# Patient Record
Sex: Female | Born: 2000 | Race: White | Hispanic: Yes | Marital: Single | Smoking: Never smoker
Health system: Southern US, Community
[De-identification: ages and names within clinical notes are randomized; demographics above are authoritative.]

## PROBLEM LIST (undated history)

## (undated) DIAGNOSIS — Z789 Other specified health status: Secondary | ICD-10-CM

## (undated) HISTORY — PX: NO PAST SURGERIES: SHX2092

---
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
## 2022-03-24 NOTE — L&D Delivery Note (Signed)
Operative Delivery Note At 4:39 PM a viable female was delivered via Vaginal, [employer], operational).  Presentation: vertex; Position: Right,, Occiput,, Anterior; Station: +2.  Verbal consent: obtained from patient.  Risks and benefits discussed in detail.  Risks include, but are not limited to the risks of anesthesia, bleeding, infection, damage to maternal tissues, fetal cephalhematoma.  There is also the risk of inability to effect vaginal delivery of the head, or shoulder dystocia that cannot be resolved by established maneuvers, leading to the need for emergency cesarean section.  APGAR: 9, 9; weight  .   Placenta status:normal , .   Cord:  with the following complications: loose nuchal cord x 1 .  Cord pH: not obtained  Anesthesia:   Instruments: mushroom Episiotomy: None Lacerations: Vaginal Suture Repair: 3.0 chromic Est. Blood Loss (mL):  300  Mom to postpartum.  Baby to Couplet care / Skin to Skin.  Jeani Hawking 02/09/2023, 5:00 PM
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
## 2023-01-27 LAB — OB RESULTS CONSOLE RPR: RPR: NONREACTIVE

## 2023-01-27 LAB — OB RESULTS CONSOLE RUBELLA ANTIBODY, IGM: Rubella: IMMUNE

## 2023-01-27 LAB — OB RESULTS CONSOLE VARICELLA ZOSTER ANTIBODY, IGG: Varicella: IMMUNE

## 2023-01-27 LAB — OB RESULTS CONSOLE HEPATITIS B SURFACE ANTIGEN: Hepatitis B Surface Ag: NEGATIVE

## 2023-01-27 LAB — OB RESULTS CONSOLE HIV ANTIBODY (ROUTINE TESTING): HIV: NONREACTIVE

## 2023-01-27 LAB — OB RESULTS CONSOLE GC/CHLAMYDIA
Chlamydia: NEGATIVE
Neisseria Gonorrhea: NEGATIVE

## 2023-01-27 LAB — HEPATITIS C ANTIBODY: HCV Ab: NEGATIVE

## 2023-02-06 LAB — OB RESULTS CONSOLE GBS: GBS: NEGATIVE

## 2023-02-08 ENCOUNTER — Encounter (HOSPITAL_COMMUNITY): Payer: Self-pay

## 2023-02-08 ENCOUNTER — Inpatient Hospital Stay (HOSPITAL_COMMUNITY)
Admission: AD | Admit: 2023-02-08 | Discharge: 2023-02-11 | DRG: 806 | Disposition: A | Discharge status: Home / Self Care | Attending: Obstetrics and Gynecology | Admitting: Obstetrics and Gynecology

## 2023-02-08 DIAGNOSIS — D509 Iron deficiency anemia, unspecified: Secondary | ICD-10-CM | POA: Diagnosis present

## 2023-02-08 DIAGNOSIS — O99214 Obesity complicating childbirth: Secondary | ICD-10-CM | POA: Diagnosis present

## 2023-02-08 DIAGNOSIS — D62 Acute posthemorrhagic anemia: Secondary | ICD-10-CM | POA: Diagnosis not present

## 2023-02-08 DIAGNOSIS — O9902 Anemia complicating childbirth: Secondary | ICD-10-CM | POA: Diagnosis present

## 2023-02-08 DIAGNOSIS — Z833 Family history of diabetes mellitus: Secondary | ICD-10-CM | POA: Diagnosis not present

## 2023-02-08 DIAGNOSIS — E66813 Obesity, class 3: Secondary | ICD-10-CM | POA: Diagnosis present

## 2023-02-08 DIAGNOSIS — O4292 Full-term premature rupture of membranes, unspecified as to length of time between rupture and onset of labor: Secondary | ICD-10-CM | POA: Diagnosis present

## 2023-02-08 DIAGNOSIS — Z3A40 40 weeks gestation of pregnancy: Secondary | ICD-10-CM

## 2023-02-08 HISTORY — DX: Other specified health status: Z78.9

## 2023-02-08 LAB — CBC
HCT: 29.7 % — ABNORMAL LOW (ref 36.0–46.0)
Hemoglobin: 9.2 g/dL — ABNORMAL LOW (ref 12.0–15.0)
MCH: 23.7 pg — ABNORMAL LOW (ref 26.0–34.0)
MCHC: 31 g/dL (ref 30.0–36.0)
MCV: 76.3 fL — ABNORMAL LOW (ref 80.0–100.0)
Platelets: 389 10*3/uL (ref 150–400)
RBC: 3.89 MIL/uL (ref 3.87–5.11)
RDW: 16 % — ABNORMAL HIGH (ref 11.5–15.5)
WBC: 12.6 10*3/uL — ABNORMAL HIGH (ref 4.0–10.5)
nRBC: 0.6 % — ABNORMAL HIGH (ref 0.0–0.2)

## 2023-02-08 LAB — TYPE AND SCREEN
ABO/RH(D): AB POS
Antibody Screen: NEGATIVE

## 2023-02-08 LAB — POCT FERN TEST

## 2023-02-08 MED ORDER — SOD CITRATE-CITRIC ACID 500-334 MG/5ML PO SOLN: 30.0000 mL | ORAL | Status: DC | PRN

## 2023-02-08 MED ORDER — LIDOCAINE HCL (PF) 1 % IJ SOLN: 30.0000 mL | INTRAMUSCULAR | Status: DC | PRN

## 2023-02-08 MED ORDER — LACTATED RINGERS IV SOLN
INTRAVENOUS | Status: DC
  Administered 2023-02-08: 1000 mL via INTRAVENOUS

## 2023-02-08 MED ORDER — OXYTOCIN BOLUS FROM INFUSION
333.0000 mL | Freq: Once | INTRAVENOUS | Status: AC
  Administered 2023-02-09: 333 mL via INTRAVENOUS

## 2023-02-08 MED ORDER — OXYCODONE-ACETAMINOPHEN 5-325 MG PO TABS: 1.0000 | ORAL_TABLET | ORAL | Status: DC | PRN

## 2023-02-08 MED ORDER — LACTATED RINGERS IV SOLN: 500.0000 mL | INTRAVENOUS | Status: DC | PRN

## 2023-02-08 MED ORDER — ACETAMINOPHEN 325 MG PO TABS
650.0000 mg | ORAL_TABLET | ORAL | Status: DC | PRN
  Administered 2023-02-09: 650 mg via ORAL
  Filled 2023-02-08: qty 2

## 2023-02-08 MED ORDER — OXYCODONE-ACETAMINOPHEN 5-325 MG PO TABS: 2.0000 | ORAL_TABLET | ORAL | Status: DC | PRN

## 2023-02-08 MED ORDER — FENTANYL CITRATE (PF) 100 MCG/2ML IJ SOLN
50.0000 ug | INTRAMUSCULAR | Status: DC | PRN
  Administered 2023-02-09 (×5): 100 ug via INTRAVENOUS
  Filled 2023-02-08 (×5): qty 2

## 2023-02-08 MED ORDER — ONDANSETRON HCL 4 MG/2ML IJ SOLN: 4.0000 mg | Freq: Four times a day (QID) | INTRAMUSCULAR | Status: DC | PRN

## 2023-02-08 MED ORDER — OXYTOCIN-SODIUM CHLORIDE 30-0.9 UT/500ML-% IV SOLN: 2.5000 [IU]/h | INTRAVENOUS | Status: DC

## 2023-02-08 MED ORDER — FLEET ENEMA RE ENEM: 1.0000 | ENEMA | RECTAL | Status: DC | PRN

## 2023-02-08 NOTE — MAU Note (Signed)
..  Kristen Blair is a 22 y.o. at [redacted]w[redacted]d here in MAU reporting: possible contractions. Cramps that have been going on all day but have gotten worse 2 hours ago.  Reports vaginal bleeding/ leaking all day? Reports it has been spotting and say fluid in her underwear. +FM Was 1 cm on Monday Pain score: 5/10 Vitals:   02/08/23 2026  BP: 126/71  Pulse: 86  Resp: 15  Temp: 98 F (36.7 C)  SpO2: 100%     FHT:148 Lab orders placed from triage: Mau labor evaluation standing order set

## 2023-02-08 NOTE — H&P (Signed)
Kristen Blair is a 22 y.o. female presenting for PROM now in latent labor.  PROM this afternoon and now with painful ctxs Pregnancy uncomplicated but late transfer from [place] at 38 weeks.  GBS negative. OB History     Gravida  2   Para      Term      Preterm      AB  1   Living         SAB  1   IAB      Ectopic      Multiple      Live Births             Past Medical History:  Diagnosis Date   Medical history non-contributory    Past Surgical History:  Procedure Laterality Date   NO PAST SURGERIES     Family History: family history includes Diabetes in her mother. Social History:  reports that she has never smoked. She has never used smokeless tobacco. She reports that she does not currently use alcohol. She reports that she does not use drugs.     Maternal Diabetes: No Genetic Screening: Normal Maternal Ultrasounds/Referrals: Normal Fetal Ultrasounds or other Referrals:  None Maternal Substance Abuse:  No Significant Maternal Medications:  None Significant Maternal Lab Results:  Group B Strep negative Number of Prenatal Visits:greater than 3 verified prenatal visits Maternal Vaccinations:TDap Other Comments:  None  Review of Systems History Dilation: 3 Effacement (%): 90 Station: -1 Exam by:: Ginnie Smart RN Blood pressure 133/71, pulse 78, temperature 98 F (36.7 C), temperature source Oral, resp. rate 15, height 5\' 2"  (1.575 m), weight 95.8 kg, SpO2 99%. Exam Physical Exam  Prenatal labs: Vitals and nursing note reviewed. Exam conducted with a chaperone present.  Constitutional:      Appearance: Normal appearance.  HENT:     Head: Normocephalic.  Eyes:     Pupils: Pupils are equal, round, and reactive to light.  Cardiovascular:     Rate and Rhythm: Normal rate and regular rhythm.     Pulses: Normal pulses.  Abdominal:     General: Abdomen is Gravid, nontender Neurological:     Mental Status: She is alert. ABO, Rh: --/--/AB POS  (11/17 2135) Antibody: NEG (11/17 2135) Rubella:   RPR:    HBsAg:    HIV:    GBS:     Assessment/Plan: IUP at 40 weeks SROM with latent labor - pt desires unmedicated for now Anticipate SVD Will use pitocin if needed.  Now laboring well   Turner Daniels 02/08/2023, 10:49 PM

## 2023-02-09 ENCOUNTER — Inpatient Hospital Stay (HOSPITAL_COMMUNITY): Admitting: Anesthesiology

## 2023-02-09 ENCOUNTER — Other Ambulatory Visit: Payer: Self-pay

## 2023-02-09 ENCOUNTER — Encounter (HOSPITAL_COMMUNITY): Payer: Self-pay | Admitting: Obstetrics and Gynecology

## 2023-02-09 LAB — RPR: RPR Ser Ql: NONREACTIVE

## 2023-02-09 MED ORDER — SENNOSIDES-DOCUSATE SODIUM 8.6-50 MG PO TABS
2.0000 | ORAL_TABLET | Freq: Every day | ORAL | Status: DC
  Administered 2023-02-10 – 2023-02-11 (×2): 2 via ORAL
  Filled 2023-02-09 (×2): qty 2

## 2023-02-09 MED ORDER — BISACODYL 10 MG RE SUPP: 10.0000 mg | Freq: Every day | RECTAL | Status: DC | PRN

## 2023-02-09 MED ORDER — DIBUCAINE (PERIANAL) 1 % EX OINT: 1.0000 | TOPICAL_OINTMENT | CUTANEOUS | Status: DC | PRN

## 2023-02-09 MED ORDER — LIDOCAINE HCL (PF) 1 % IJ SOLN
INTRAMUSCULAR | Status: DC | PRN
  Administered 2023-02-09: 3 mL via EPIDURAL
  Administered 2023-02-09: 2 mL via EPIDURAL
  Administered 2023-02-09: 5 mL via EPIDURAL

## 2023-02-09 MED ORDER — BENZOCAINE-MENTHOL 20-0.5 % EX AERO: 1.0000 | INHALATION_SPRAY | CUTANEOUS | Status: DC | PRN

## 2023-02-09 MED ORDER — SIMETHICONE 80 MG PO CHEW: 80.0000 mg | CHEWABLE_TABLET | ORAL | Status: DC | PRN

## 2023-02-09 MED ORDER — DIPHENHYDRAMINE HCL 50 MG/ML IJ SOLN: 12.5000 mg | INTRAMUSCULAR | Status: DC | PRN

## 2023-02-09 MED ORDER — FLEET ENEMA RE ENEM: 1.0000 | ENEMA | Freq: Every day | RECTAL | Status: DC | PRN

## 2023-02-09 MED ORDER — PHENYLEPHRINE 80 MCG/ML (10ML) SYRINGE FOR IV PUSH (FOR BLOOD PRESSURE SUPPORT): 80.0000 ug | PREFILLED_SYRINGE | INTRAVENOUS | Status: DC | PRN

## 2023-02-09 MED ORDER — WITCH HAZEL-GLYCERIN EX PADS: 1.0000 | MEDICATED_PAD | CUTANEOUS | Status: DC | PRN

## 2023-02-09 MED ORDER — EPHEDRINE 5 MG/ML INJ: 10.0000 mg | INTRAVENOUS | Status: DC | PRN

## 2023-02-09 MED ORDER — FENTANYL-BUPIVACAINE-NACL 0.5-0.125-0.9 MG/250ML-% EP SOLN
12.0000 mL/h | EPIDURAL | Status: DC | PRN
  Administered 2023-02-09: 12 mL/h via EPIDURAL
  Filled 2023-02-09: qty 250

## 2023-02-09 MED ORDER — LACTATED RINGERS IV SOLN: 500.0000 mL | Freq: Once | INTRAVENOUS | Status: DC

## 2023-02-09 MED ORDER — ZOLPIDEM TARTRATE 5 MG PO TABS: 5.0000 mg | ORAL_TABLET | Freq: Every evening | ORAL | Status: DC | PRN

## 2023-02-09 MED ORDER — COCONUT OIL OIL: 1.0000 | TOPICAL_OIL | Status: DC | PRN

## 2023-02-09 MED ORDER — TERBUTALINE SULFATE 1 MG/ML IJ SOLN: 0.2500 mg | Freq: Once | INTRAMUSCULAR | Status: DC | PRN

## 2023-02-09 MED ORDER — PRENATAL MULTIVITAMIN CH
1.0000 | ORAL_TABLET | Freq: Every day | ORAL | Status: DC
  Administered 2023-02-10: 1 via ORAL
  Filled 2023-02-09: qty 1

## 2023-02-09 MED ORDER — DIPHENHYDRAMINE HCL 25 MG PO CAPS: 25.0000 mg | ORAL_CAPSULE | Freq: Four times a day (QID) | ORAL | Status: DC | PRN

## 2023-02-09 MED ORDER — ONDANSETRON HCL 4 MG/2ML IJ SOLN: 4.0000 mg | INTRAMUSCULAR | Status: DC | PRN

## 2023-02-09 MED ORDER — OXYTOCIN-SODIUM CHLORIDE 30-0.9 UT/500ML-% IV SOLN
1.0000 m[IU]/min | INTRAVENOUS | Status: DC
Start: 2023-02-09 — End: 2023-02-09
  Administered 2023-02-09: 2 m[IU]/min via INTRAVENOUS
  Filled 2023-02-09: qty 500

## 2023-02-09 MED ORDER — TETANUS-DIPHTH-ACELL PERTUSSIS 5-2.5-18.5 LF-MCG/0.5 IM SUSY: 0.5000 mL | PREFILLED_SYRINGE | Freq: Once | INTRAMUSCULAR | Status: DC

## 2023-02-09 MED ORDER — ONDANSETRON HCL 4 MG PO TABS: 4.0000 mg | ORAL_TABLET | ORAL | Status: DC | PRN

## 2023-02-09 MED ORDER — MEASLES, MUMPS & RUBELLA VAC IJ SOLR: 0.5000 mL | Freq: Once | INTRAMUSCULAR | Status: DC

## 2023-02-09 MED ORDER — ACETAMINOPHEN 325 MG PO TABS
650.0000 mg | ORAL_TABLET | ORAL | Status: DC | PRN
  Administered 2023-02-09 – 2023-02-11 (×5): 650 mg via ORAL
  Filled 2023-02-09 (×5): qty 2

## 2023-02-09 MED ORDER — MEDROXYPROGESTERONE ACETATE 150 MG/ML IM SUSP: 150.0000 mg | INTRAMUSCULAR | Status: DC | PRN

## 2023-02-09 MED ORDER — IBUPROFEN 600 MG PO TABS
600.0000 mg | ORAL_TABLET | Freq: Four times a day (QID) | ORAL | Status: DC
  Administered 2023-02-09 – 2023-02-11 (×6): 600 mg via ORAL
  Filled 2023-02-09 (×6): qty 1

## 2023-02-09 NOTE — Progress Notes (Signed)
Patient just received pain medication  BP 125/69   Pulse 79   Temp 98.7 F (37.1 C)   Resp 15   Ht 5\' 2"  (1.575 m)   Wt 95.8 kg   SpO2 99%   BMI 38.63 kg/m  Results for orders placed or performed during the hospital encounter of 02/08/23 (from the past 24 hour(s))  POCT fern test     Status: Abnormal   Collection Time: 02/08/23  9:25 PM  Result Value Ref Range   POCT Fern Test     Positive    CBC     Status: Abnormal   Collection Time: 02/08/23  9:35 PM  Result Value Ref Range   WBC 12.6 (H) 4.0 - 10.5 K/uL   RBC 3.89 3.87 - 5.11 MIL/uL   Hemoglobin 9.2 (L) 12.0 - 15.0 g/dL   HCT 95.6 (L) 38.7 - 56.4 %   MCV 76.3 (L) 80.0 - 100.0 fL   MCH 23.7 (L) 26.0 - 34.0 pg   MCHC 31.0 30.0 - 36.0 g/dL   RDW 33.2 (H) 95.1 - 88.4 %   Platelets 389 150 - 400 K/uL   nRBC 0.6 (H) 0.0 - 0.2 %  Type and screen [facility]     Status: None   Collection Time: 02/08/23  9:35 PM  Result Value Ref Range   ABO/RH(D) AB POS    Antibody Screen NEG    Sample Expiration      02/11/2023,2359 Performed at [facility], [address], [place], [place] [address]    FHR is category 1  Cervix is 100% 5.5 cm 0  Vertex Forebag ruptured  Anticipate NSVD

## 2023-02-09 NOTE — Anesthesia Preprocedure Evaluation (Signed)
Anesthesia Evaluation  Patient identified by MRN, date of birth, ID band Patient awake    Reviewed: Allergy & Precautions, NPO status , Patient's Chart, lab work & pertinent test results  Airway Mallampati: III  TM Distance: >3 FB Neck ROM: Full    Dental  (+) Teeth Intact, Dental Advisory Given   Pulmonary neg pulmonary ROS   Pulmonary exam normal breath sounds clear to auscultation       Cardiovascular negative cardio ROS Normal cardiovascular exam Rhythm:Regular Rate:Normal     Neuro/Psych negative neurological ROS     GI/Hepatic negative GI ROS, Neg liver ROS,,,  Endo/Other    Class 3 obesity  Renal/GU negative Renal ROS     Musculoskeletal negative musculoskeletal ROS (+)    Abdominal   Peds  Hematology  (+) Blood dyscrasia, anemia Plt 389k   Anesthesia Other Findings Day of surgery medications reviewed with the patient.  Reproductive/Obstetrics (+) Pregnancy                             Anesthesia Physical Anesthesia Plan  ASA: 2  Anesthesia Plan: Spinal   Post-op Pain Management:    Induction:   PONV Risk Score and Plan: 2 and Treatment may vary due to age or medical condition  Airway Management Planned: Natural Airway  Additional Equipment:   Intra-op Plan:   Post-operative Plan:   Informed Consent: I have reviewed the patients History and Physical, chart, labs and discussed the procedure including the risks, benefits and alternatives for the proposed anesthesia with the patient or authorized representative who has indicated his/her understanding and acceptance.     Dental advisory given  Plan Discussed with: CRNA, Anesthesiologist and Surgeon  Anesthesia Plan Comments: (Discussed risks and benefits of and differences between spinal and general. Discussed risks of spinal including headache, backache, failure, bleeding, infection, and nerve damage. Patient  consents to spinal. Questions answered. Coagulation studies and platelet count acceptable.)       Anesthesia Quick Evaluation

## 2023-02-09 NOTE — Lactation Note (Signed)
This note was copied from a baby's chart. Lactation Consultation Note  Patient Name: Kristen Blair UJWJX'B Date: 02/09/2023 Age:21 hours  RN in L&D informed LC that MOB feeding preference is "Pumping Only" and formula feeding infant. LC services will follow up with MOB on MBU.   Maternal Data    Feeding    LATCH Score                    Lactation Tools Discussed/Used    Interventions    Discharge    Consult Status      Kristen Blair 02/09/2023, 5:06 PM

## 2023-02-09 NOTE — Anesthesia Procedure Notes (Signed)
Epidural Patient location during procedure: OB Start time: 02/09/2023 10:57 AM End time: 02/09/2023 11:04 AM  Staffing Anesthesiologist: Collene Schlichter, MD Performed: anesthesiologist   Preanesthetic Checklist Completed: patient identified, IV checked, risks and benefits discussed, monitors and equipment checked, pre-op evaluation and timeout performed  Epidural Patient position: sitting Prep: DuraPrep Patient monitoring: blood pressure and continuous pulse ox Approach: midline Location: L3-L4 Injection technique: LOR air  Needle:  Needle type: Tuohy  Needle gauge: 17 G Needle length: 9 cm Needle insertion depth: 8 cm Catheter size: 19 Gauge Catheter at skin depth: 13 cm Test dose: negative and Other (1% Lidocaine)  Additional Notes Patient identified.  Risk benefits discussed including failed block, incomplete pain control, headache, nerve damage, paralysis, blood pressure changes, nausea, vomiting, reactions to medication both toxic or allergic, and postpartum back pain.  Patient expressed understanding and wished to proceed.  All questions were answered.  Sterile technique used throughout procedure and epidural site dressed with sterile barrier dressing. No paresthesia or other complications noted. The patient did not experience any signs of intravascular injection such as tinnitus or metallic taste in mouth nor signs of intrathecal spread such as rapid motor block. Please see nursing notes for vital signs. Reason for block:procedure for pain

## 2023-02-09 NOTE — Lactation Note (Signed)
This note was copied from a baby's chart. Lactation Consultation Note  Patient Name: Kristen Blair [id number] Date: 02/09/2023 Age:21 hours Reason for consult: Initial assessment;1st time breastfeeding;Exclusive pumping and bottle feeding;Term.  MOB feeding choice is pumping only and formula feeding infant. FOB attempted bottle feed infant but infant was not doing well with slow flow nipple. Infant was switched to the [facility] nipple and pace feed, infant consumed 10 mls of 20 kcal formula. MOB was set up with DEBP, fitted with 21 mm breast flange, MOB shown how to use and clean breast pump parts. MOB was still pumping when LC left the room. MOB understands to pump every 3 hours for 15 minutes on initial setting. MOB knows that her EBM is safe for 4 hours whereas formula once open must be used within 1 hour. MOB knows to call RN/LC if their are any questions or concerns. MOB will continue to feed infant by cues, every 2-3 hours and will offer any EBM first before offering formula. MOB given handout with feeding guidelines and knows to offer 5-15 mls of EBM/Formula per feeding on Day 1 of life.  Maternal Data Does the patient have breastfeeding experience prior to this delivery?: No  Feeding Mother's Current Feeding Choice: Breast Milk and Formula Nipple Type: Nfant Standard Flow (white)  LATCH Score  MOB feeding choice is "Pumping Only and formula feeding infant.                   Lactation Tools Discussed/Used Tools: Pump;Flanges Flange Size: 21 Breast pump type: Double-Electric Breast Pump Pump Education: Setup, frequency, and cleaning;Milk Storage Reason for Pumping: MOB wants to pump only and formula feed infant. Pumping frequency: MOB will continue to pump every 3 hours for 15 minutes on inital setting.  Interventions Interventions: Breast feeding basics reviewed;Skin to skin;DEBP;Pace feeding;LC Services brochure;Guidelines for Milk Supply and Pumping Schedule  Handout;CDC Guidelines for Breast Pump Cleaning  Discharge Pump: DEBP;Personal  Consult Status Consult Status: Follow-up Date: 02/10/23 Follow-up type: In-patient    Frederico Hamman 02/09/2023, 8:30 PM

## 2023-02-10 LAB — CBC
HCT: 24.3 % — ABNORMAL LOW (ref 36.0–46.0)
Hemoglobin: 7.6 g/dL — ABNORMAL LOW (ref 12.0–15.0)
MCH: 23.8 pg — ABNORMAL LOW (ref 26.0–34.0)
MCHC: 31.3 g/dL (ref 30.0–36.0)
MCV: 75.9 fL — ABNORMAL LOW (ref 80.0–100.0)
Platelets: 354 10*3/uL (ref 150–400)
RBC: 3.2 MIL/uL — ABNORMAL LOW (ref 3.87–5.11)
RDW: 16.3 % — ABNORMAL HIGH (ref 11.5–15.5)
WBC: 14.9 10*3/uL — ABNORMAL HIGH (ref 4.0–10.5)
nRBC: 0.7 % — ABNORMAL HIGH (ref 0.0–0.2)

## 2023-02-10 MED ORDER — SODIUM CHLORIDE 0.9 % IV SOLN
100.0000 mg | Freq: Once | INTRAVENOUS | Status: DC
  Filled 2023-02-10: qty 5

## 2023-02-10 MED ORDER — SODIUM CHLORIDE 0.9 % IV SOLN
500.0000 mg | Freq: Once | INTRAVENOUS | Status: AC
  Administered 2023-02-10: 500 mg via INTRAVENOUS
  Filled 2023-02-10: qty 25

## 2023-02-10 NOTE — Progress Notes (Signed)
Post Partum Day 1 Subjective: no complaints, up ad lib, voiding, tolerating PO, and + flatus  Objective: Blood pressure (!) 112/55, pulse 92, temperature 98.2 F (36.8 C), temperature source Oral, resp. rate 15, height 5\' 2"  (1.575 m), weight 95.8 kg, SpO2 98%, unknown if currently breastfeeding.  Physical Exam:  General: alert, cooperative, and no distress Lochia: appropriate Uterine Fundus: firm Incision: healing well DVT Evaluation: No evidence of DVT seen on physical exam.  Recent Labs    02/08/23 2135 02/10/23 0442  HGB 9.2* 7.6*  HCT 29.7* 24.3*    Assessment/Plan: Plan for discharge tomorrow D/W anemia. Venofer x 1-side effects discussed. No circ   LOS: 2 days   Roselle Locus II, MD 02/10/2023, 7:28 AM

## 2023-02-10 NOTE — Anesthesia Postprocedure Evaluation (Signed)
Anesthesia Post Note  Patient: Deresha Schlehuber  Procedure(s) Performed: AN AD HOC LABOR EPIDURAL     Patient location during evaluation: Mother Baby Anesthesia Type: Spinal Level of consciousness: awake, oriented and awake and alert Pain management: pain level controlled Vital Signs Assessment: post-procedure vital signs reviewed and stable Respiratory status: spontaneous breathing, respiratory function stable and nonlabored ventilation Cardiovascular status: stable Postop Assessment: no headache, adequate PO intake, able to ambulate, patient able to bend at knees and no apparent nausea or vomiting Anesthetic complications: no   No notable events documented.  Last Vitals:  Vitals:   02/10/23 0320 02/10/23 0809  BP: (!) 112/55 121/74  Pulse: 92 76  Resp: 15 20  Temp: 36.8 C 36.6 C  SpO2: 98% 100%    Last Pain:  Vitals:   02/10/23 1127  TempSrc:   PainSc: 6    Pain Goal: Patients Stated Pain Goal: 0 (02/08/23 2025)                 Thijs Brunton

## 2023-02-11 MED ORDER — IBUPROFEN 600 MG PO TABS: 600.0000 mg | ORAL_TABLET | Freq: Four times a day (QID) | ORAL | 0 refills | Status: AC

## 2023-02-11 MED ORDER — ACETAMINOPHEN 325 MG PO TABS: 650.0000 mg | ORAL_TABLET | ORAL | 1 refills | Status: AC | PRN

## 2023-02-11 MED ORDER — SENNOSIDES-DOCUSATE SODIUM 8.6-50 MG PO TABS: 2.0000 | ORAL_TABLET | Freq: Every day | ORAL | 1 refills | Status: AC

## 2023-02-11 NOTE — Discharge Summary (Signed)
Postpartum Discharge Summary  Date of Service updated 02/11/2023     Patient Name: Kristen Blair DOB: [birth year] MRN: [id number]  Date of admission: 02/08/2023 Delivery date:02/09/2023 Delivering provider: Marcelle Overlie Date of discharge: 02/11/2023  Admitting diagnosis: Normal labor [O80, Z37.9] Vacuum extractor delivery, delivered [O75.9] Intrauterine pregnancy: [redacted]w[redacted]d     Secondary diagnosis:  Principal Problem:   Normal labor Active Problems:   Vacuum extractor delivery, delivered  Additional problems: Chronic anemia likely secondary to iron deficiency, late transfer of care    Discharge diagnosis: Term Pregnancy Delivered and Anemia (chronic, likely iron deficiency)                                        Post partum procedures: none Augmentation: Pitocin, AROM Complications: None  Hospital course: Onset of Labor With Vaginal Delivery      22 y.o. yo G2P1011 at [redacted]w[redacted]d was admitted in Latent Labor on 02/08/2023. Labor course was complicated by none.  Membrane Rupture Time/Date: 11:00 AM,02/08/2023  Delivery Method:Vaginal, Vacuum (Extractor) Episiotomy: None Lacerations:  Vaginal Patient had a postpartum course complicated by chronic anemia, likely iron deficiency for which she received a dose of IV iron.  She is ambulating, tolerating a regular diet, passing flatus, and urinating well. Patient is discharged home in stable condition on 02/11/23.  Newborn Data: Birth date:02/09/2023 Birth time:4:39 PM Gender:Female Living status:Living Apgars:9 ,9  Weight:3330 g  Magnesium Sulfate received: No BMZ received: No Rhophylac:N/A MMR:N/A T-DaP:Given prenatally Flu: No RSV Vaccine received: No Transfusion:No Immunizations administered: Immunization History  Administered Date(s) Administered   PFIZER(Purple Top)SARS-COV-2 Vaccination 12/07/2019, 12/31/2019    Physical exam  Vitals:   02/10/23 0809 02/10/23 1521 02/10/23 2140 02/11/23 0532  BP: 121/74  119/74 (!) 107/57 115/71  Pulse: 76 81 84 79  Resp: 20 18    Temp: 97.9 F (36.6 C) 98 F (36.7 C) 98.1 F (36.7 C) 98.2 F (36.8 C)  TempSrc: Oral Oral Oral   SpO2: 100% 99% 100% 99%  Weight:      Height:       General: alert, cooperative, and no distress Lochia: appropriate Uterine Fundus: firm Incision: N/A DVT Evaluation: No evidence of DVT seen on physical exam. Labs: Lab Results  Component Value Date   WBC 14.9 (H) 02/10/2023   HGB 7.6 (L) 02/10/2023   HCT 24.3 (L) 02/10/2023   MCV 75.9 (L) 02/10/2023   PLT 354 02/10/2023       No data to display         Edinburgh Score:    02/10/2023    8:09 AM  Edinburgh Postnatal Depression Scale Screening Tool  I have been able to laugh and see the funny side of things. 0  I have looked forward with enjoyment to things. 0  I have blamed myself unnecessarily when things went wrong. 1  I have been anxious or worried for no good reason. 1  I have felt scared or panicky for no good reason. 0  Things have been getting on top of me. 0  I have been so unhappy that I have had difficulty sleeping. 0  I have felt sad or miserable. 0  I have been so unhappy that I have been crying. 0  The thought of harming myself has occurred to me. 0  Edinburgh Postnatal Depression Scale Total 2      After visit meds:  Allergies  as of 02/11/2023   No Known Allergies      Medication List     STOP taking these medications    naproxen 500 MG tablet Commonly known as: Naprosyn   orphenadrine 100 MG tablet Commonly known as: NORFLEX       TAKE these medications    acetaminophen 325 MG tablet Commonly known as: Tylenol Take 2 tablets (650 mg total) by mouth every 4 (four) hours as needed (for pain scale < 4).   ibuprofen 600 MG tablet Commonly known as: ADVIL Take 1 tablet (600 mg total) by mouth every 6 (six) hours.   senna-docusate 8.6-50 MG tablet Commonly known as: Senokot-S Take 2 tablets by mouth daily.          Discharge home in stable condition Infant Feeding: Bottle and Breast Infant Disposition:home with mother Discharge instruction: per After Visit Summary and Postpartum booklet. Activity: Advance as tolerated. Pelvic rest for 6 weeks.  Diet: routine diet Anticipated Birth Control: Unsure Postpartum Appointment:6 weeks Additional Postpartum F/U:  none Future Appointments:No future appointments.   02/11/2023 Tawni Levy, MD

## 2023-02-18 ENCOUNTER — Telehealth (HOSPITAL_COMMUNITY): Payer: Self-pay | Admitting: *Deleted

## 2023-02-18 NOTE — Telephone Encounter (Signed)
02/18/2023  Name: Kristen Blair MRN: [id number] DOB: 03/13/2001  Reason for Call:  Transition of Care Hospital Discharge Call  Contact Status: Patient Contact Status: Complete  Language assistant needed: Interpreter Mode: Interpreter Not Needed        Follow-Up Questions: Do You Have Any Concerns About Your Health As You Heal From Delivery?: No Do You Have Any Concerns About Your Infants Health?: No  Edinburgh Postnatal Depression Scale:  In the Past 7 Days:    PHQ2-9 Depression Scale:     Discharge Follow-up: Edinburgh score requires follow up?: N/A (Patient declines completing, stating that her answers have not changed since her hospital stay and she feels that she is doing well emotionally.) Patient was advised of the following resources:: Support Group, Breastfeeding Support Group  Post-discharge interventions: Reviewed Newborn Safe Sleep Practices  [name]  02/18/2023 1136
